# Patient Record
Sex: Female | Born: 1940 | Race: White | Hispanic: No
Health system: Southern US, Community
[De-identification: ages and names within clinical notes are randomized; demographics above are authoritative.]

---

## 1998-05-01 ENCOUNTER — Other Ambulatory Visit: Admission: RE | Admit: 1998-05-01 | Discharge: 1998-05-01 | Payer: Self-pay | Admitting: Obstetrics and Gynecology

## 1999-06-02 ENCOUNTER — Other Ambulatory Visit: Admission: RE | Admit: 1999-06-02 | Discharge: 1999-06-02 | Payer: Self-pay | Admitting: Obstetrics and Gynecology

## 1999-09-02 ENCOUNTER — Emergency Department (HOSPITAL_COMMUNITY): Admission: EM | Admit: 1999-09-02 | Discharge: 1999-09-02 | Payer: Self-pay | Admitting: Emergency Medicine

## 2000-08-03 ENCOUNTER — Other Ambulatory Visit: Admission: RE | Admit: 2000-08-03 | Discharge: 2000-08-03 | Payer: Self-pay | Admitting: Obstetrics and Gynecology

## 2002-02-09 ENCOUNTER — Other Ambulatory Visit: Admission: RE | Admit: 2002-02-09 | Discharge: 2002-02-09 | Payer: Self-pay | Admitting: Obstetrics and Gynecology

## 2003-02-28 ENCOUNTER — Other Ambulatory Visit: Admission: RE | Admit: 2003-02-28 | Discharge: 2003-02-28 | Payer: Self-pay | Admitting: Obstetrics and Gynecology

## 2004-06-25 ENCOUNTER — Other Ambulatory Visit: Admission: RE | Admit: 2004-06-25 | Discharge: 2004-06-25 | Payer: Self-pay | Admitting: Obstetrics and Gynecology

## 2005-02-11 ENCOUNTER — Encounter: Admission: RE | Admit: 2005-02-11 | Discharge: 2005-02-11 | Payer: Self-pay | Admitting: Internal Medicine

## 2005-07-30 ENCOUNTER — Other Ambulatory Visit: Admission: RE | Admit: 2005-07-30 | Discharge: 2005-07-30 | Payer: Self-pay | Admitting: Obstetrics and Gynecology

## 2005-09-01 ENCOUNTER — Encounter: Admission: RE | Admit: 2005-09-01 | Discharge: 2005-09-01 | Payer: Self-pay | Admitting: Internal Medicine

## 2005-09-01 ENCOUNTER — Other Ambulatory Visit: Admission: RE | Admit: 2005-09-01 | Discharge: 2005-09-01 | Payer: Self-pay | Admitting: Interventional Radiology

## 2005-09-01 ENCOUNTER — Encounter (INDEPENDENT_AMBULATORY_CARE_PROVIDER_SITE_OTHER): Payer: Self-pay | Admitting: Specialist

## 2006-08-05 ENCOUNTER — Other Ambulatory Visit: Admission: RE | Admit: 2006-08-05 | Discharge: 2006-08-05 | Payer: Self-pay | Admitting: Obstetrics and Gynecology

## 2007-08-10 ENCOUNTER — Other Ambulatory Visit: Admission: RE | Admit: 2007-08-10 | Discharge: 2007-08-10 | Payer: Self-pay | Admitting: Obstetrics and Gynecology

## 2008-08-16 ENCOUNTER — Ambulatory Visit: Payer: Self-pay | Admitting: Obstetrics and Gynecology

## 2008-08-16 ENCOUNTER — Encounter: Payer: Self-pay | Admitting: Obstetrics and Gynecology

## 2008-08-16 ENCOUNTER — Other Ambulatory Visit: Admission: RE | Admit: 2008-08-16 | Discharge: 2008-08-16 | Payer: Self-pay | Admitting: Obstetrics and Gynecology

## 2008-09-20 ENCOUNTER — Ambulatory Visit: Payer: Self-pay | Admitting: Obstetrics and Gynecology

## 2008-10-09 ENCOUNTER — Ambulatory Visit: Payer: Self-pay | Admitting: Gynecology

## 2008-10-17 ENCOUNTER — Ambulatory Visit: Payer: Self-pay | Admitting: Obstetrics and Gynecology

## 2008-10-22 ENCOUNTER — Ambulatory Visit: Payer: Self-pay | Admitting: Obstetrics and Gynecology

## 2009-02-11 ENCOUNTER — Ambulatory Visit: Payer: Self-pay | Admitting: Obstetrics and Gynecology

## 2009-02-28 ENCOUNTER — Other Ambulatory Visit: Admission: RE | Admit: 2009-02-28 | Discharge: 2009-02-28 | Payer: Self-pay | Admitting: Otolaryngology

## 2009-03-13 ENCOUNTER — Ambulatory Visit (HOSPITAL_COMMUNITY): Admission: RE | Admit: 2009-03-13 | Discharge: 2009-03-13 | Payer: Self-pay | Admitting: Otolaryngology

## 2009-03-13 ENCOUNTER — Encounter: Payer: Self-pay | Admitting: Otolaryngology

## 2009-03-21 ENCOUNTER — Ambulatory Visit: Payer: Self-pay | Admitting: Oncology

## 2009-03-27 ENCOUNTER — Ambulatory Visit: Admission: RE | Admit: 2009-03-27 | Discharge: 2009-04-18 | Payer: Self-pay | Admitting: Radiation Oncology

## 2009-06-02 DEATH — deceased

## 2010-04-23 LAB — COMPREHENSIVE METABOLIC PANEL
ALT: 16 U/L (ref 0–35)
AST: 21 U/L (ref 0–37)
CO2: 28 mEq/L (ref 19–32)
Chloride: 101 mEq/L (ref 96–112)
Creatinine, Ser: 0.77 mg/dL (ref 0.4–1.2)
GFR calc Af Amer: >60 mL/min (ref >60)
Glucose, Bld: 103 mg/dL — ABNORMAL HIGH (ref 70–99)

## 2010-04-23 LAB — CBC
HCT: 44.9 % (ref 36.0–46.0)
Hemoglobin: 15.6 g/dL — ABNORMAL HIGH (ref 12.0–15.0)
MCV: 92.5 fL (ref 78.0–100.0)
RBC: 4.85 MIL/uL (ref 3.87–5.11)
RDW: 12.1 % (ref 11.5–15.5)
WBC: 6.5 10*3/uL (ref 4.0–10.5)

## 2011-02-05 IMAGING — CT CT CHEST W/O CM
4 of 6 series · 15 of 30 positions shown, 17 images · non-contrast
Comparison: CT scan 02/27/2009 [HOSPITAL]

CT NECK

CLINICAL DATA: Left-sided thyroid cancer.  Rapid growth of the
left neck mass.

CT NECK AND CHEST WITHOUT CONTRAST
TECHNIQUE: Multidetector CT imaging of the neck and chest was
performed using the standard protocol without intravenous contrast.

[Series 2: chest 1st/ 70 sec delay neck · axial · delayed · 0.74mm/px · z∈[-420,-126]mm · 5 of 102 slices shown, 7 images (1 of 2)]
[im 21/102  mediastinal]
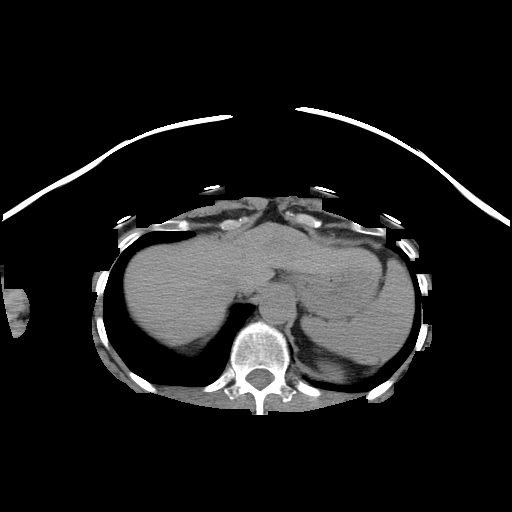
[im 21/102  lung]
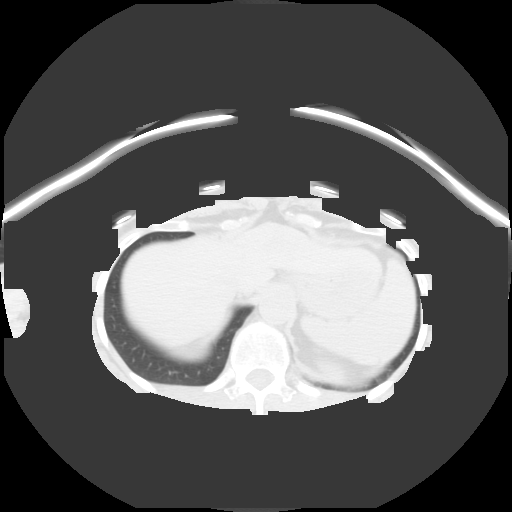
[im 41/102  lung]
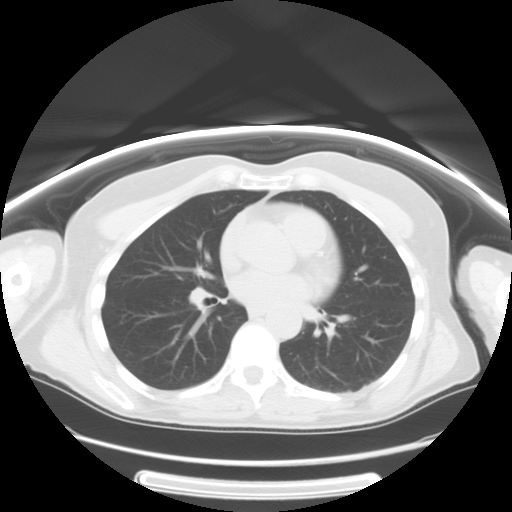
[im 50/102  lung]
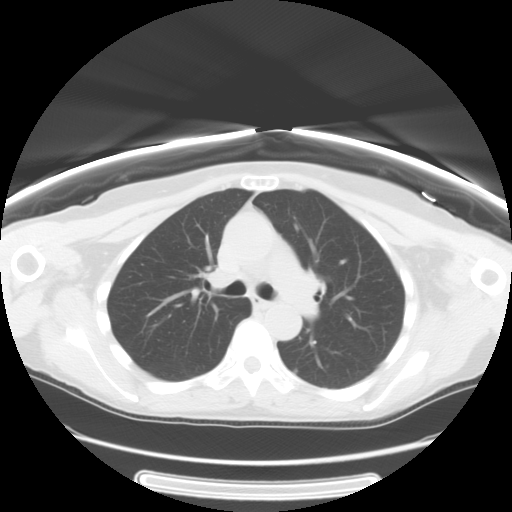
[im 61/102  lung]
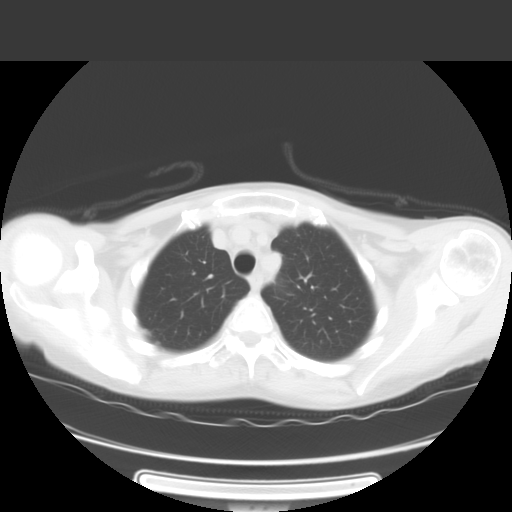
[im 81/102  mediastinal]
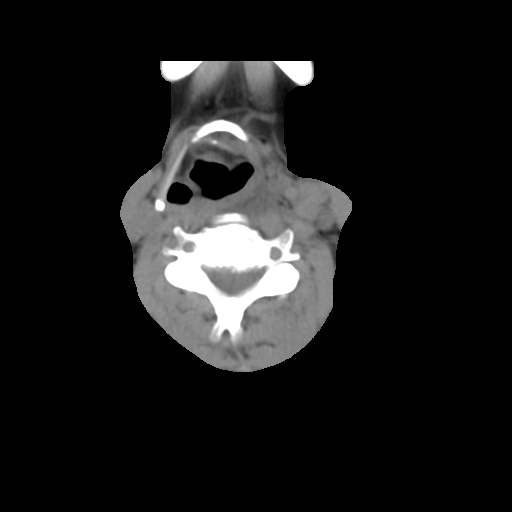
[im 81/102  lung]
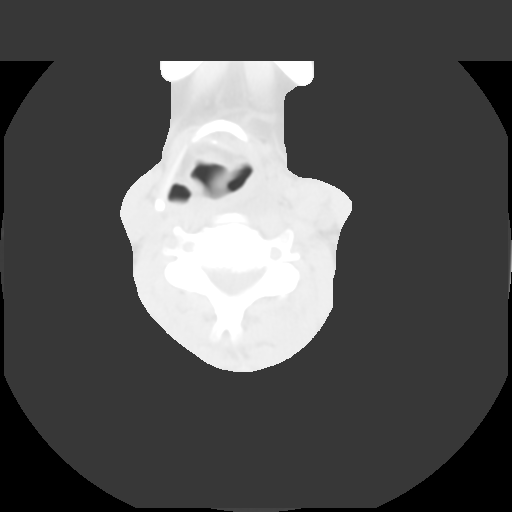

[Series 102: chest 1st/ 70 sec delay neck · axial · delayed · 0.74mm/px · z∈[-446,-277]mm · 4 of 79 slices shown (2 of 2)]
[im 20/79  lung]
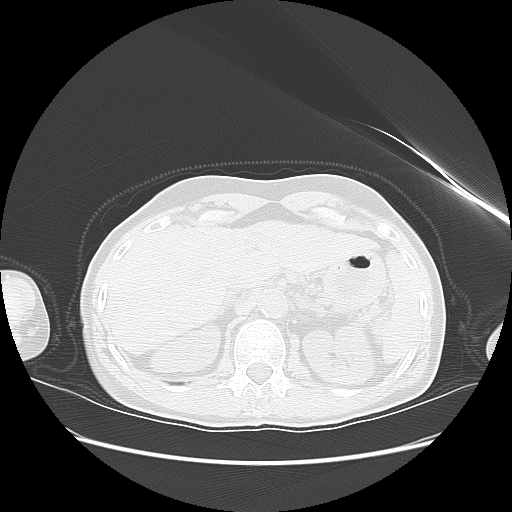
[im 40/79  lung]
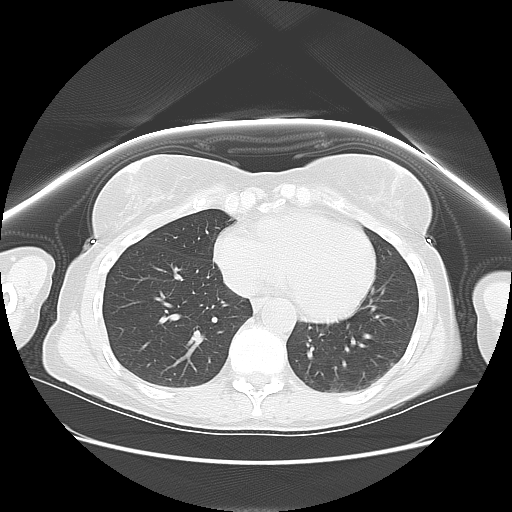
[im 59/79  lung]
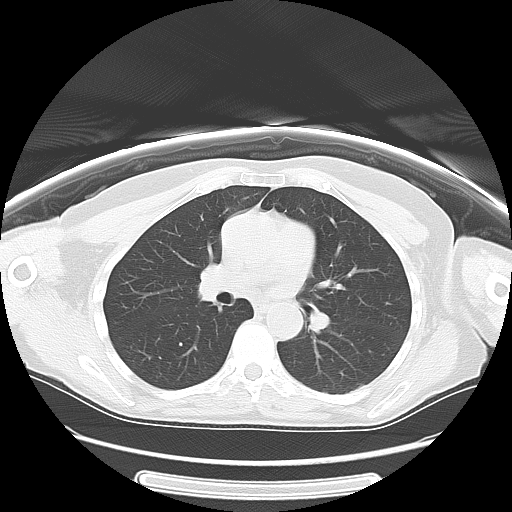
[im 65/79  lung]
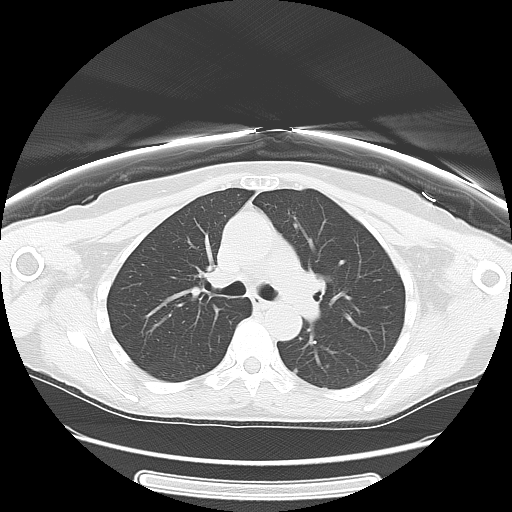

[Series 103: sagittals · sagittal · 0.74mm/px · 4 of 93 slices shown]
[im 19/93  lung]
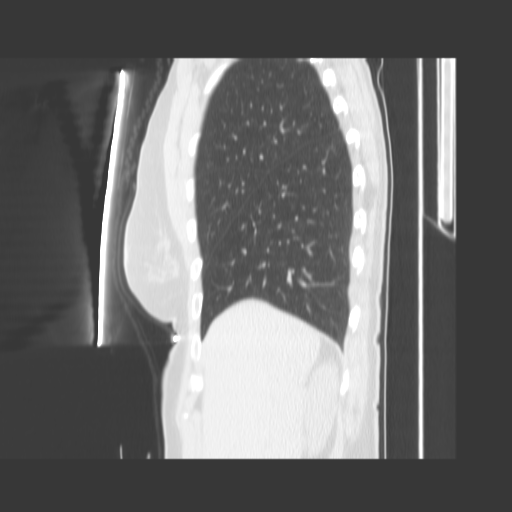
[im 37/93  lung]
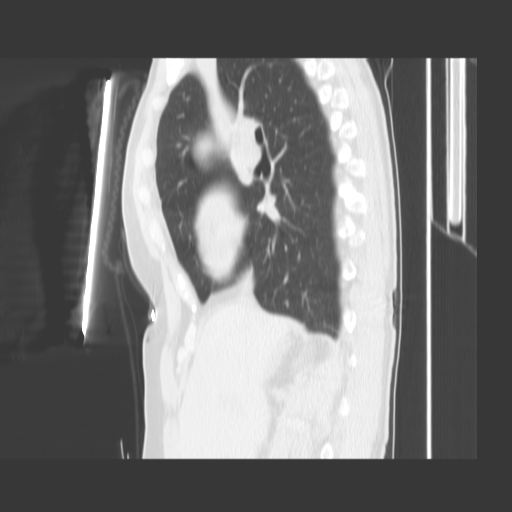
[im 56/93  lung]
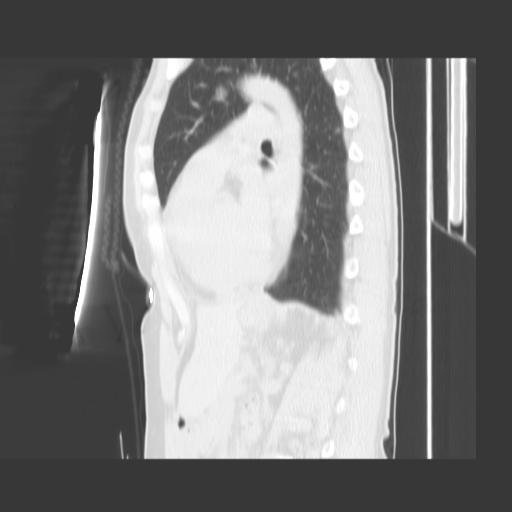
[im 74/93  lung]
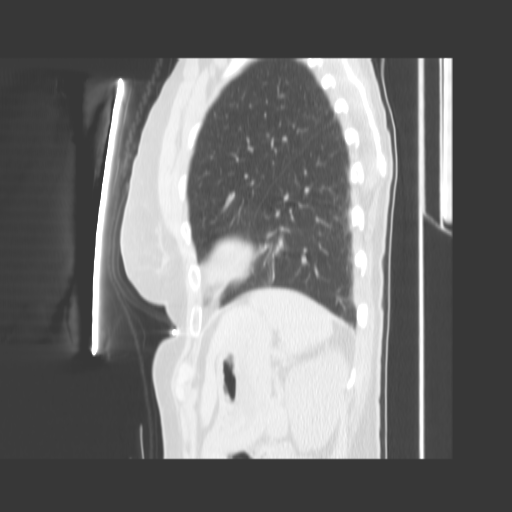

[Series 104: coronals · coronal · 0.74mm/px · 2 of 81 slices shown]
[im 21/81  lung]
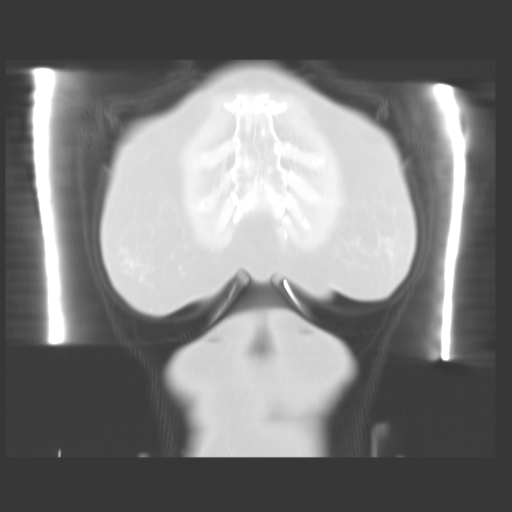
[im 41/81  lung]
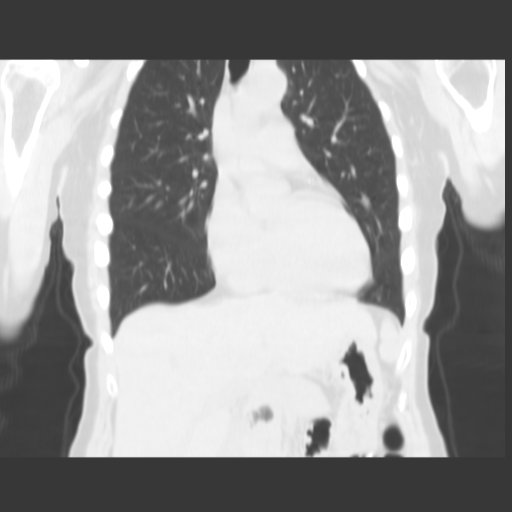

[15 of 30 positions shown; findings below may reference images not displayed]

FINDINGS: There has been considerable and rapid growth of the left
neck mass since late [REDACTED].  Previously, maximal transverse
diameter was approximately 5 x 3 cm.  Today, the mass measures
cm right to left and 5.3 cm front to back.

Within the left lobe of the thyroid, there is a densely calcified
focus measuring 9 mm in diameter.  Adjacent to that, there is a
peripherally calcified mass measuring 2.5 cm in diameter.
Surrounding and extending outward from that, there is abnormal low
density material consistent with tumor spread.  This displaces the
larynx, the subglottic trachea and the esophagus towards the right.
This invades the soft tissue planes of the neck centrally and
towards the left and completely encases the common carotid artery.
Upper extent of tumor is as high as the hyoid bone and extends as
far down as the thoracic inlet.  The only a definable separate
lymph node is on image 22, a level II or level III node measuring 8
mm in diameter.
IMPRESSION: Since late [REDACTED], there has been considerable growth of tumor in
the left side of the neck displacing the midline structures towards
the right.  Transverse diameter now is approximately 7.4 x 5.3 cm.
Tumor encases the left common carotid artery.  I cannot see
evidence of of the jugular vein and presumably this is obliterated.

CT CHEST
FINDINGS: Lung apices show pleural and parenchymal scarring.
Posteriorly in the right lobe on image number 31, there is a 7 mm
pulmonary nodule worrisome for a metastatic deposit.  No second
nodule is seen on the right.  On the left, there is a nodule the
left upper lobe adjacent to the aortic arch on image #7 measuring 8
mm in diameter.  There are there is a peripheral nodule in the left
lower lobe on image number 15 measuring 4 mm in diameter.  There is
a subpleural nodule in the left lower lobe on image 16 measuring 4
mm in diameter.  There is a subpleural nodule on image 19 measuring
2 mm in diameter.  There are numerous subpleural nodules in the
left lower lobe on images 23, 24 and 25.  There is a left lower
lobe nodule on image 38 measuring 5 mm in diameter.  A few other
small subpleural nodules are present in the left lower lobe.  These
could be benign or represent metastatic disease.  No mediastinal
adenopathy is discernible.  Scans in the upper abdomen shows some
prominence of the left adrenal gland.  This could represent mild
hyperplasia, but early metastatic involvement is not excluded.

The left neck tumor does extend through the thoracic inlet into the
upper mediastinum.
IMPRESSION: Large left neck tumor extends through the thoracic inlet on the
left into the superior mediastinum.

Numerous pulmonary nodules.  These could be benign or malignant are
worrisome for metastatic disease.

Prominence of the left adrenal gland.  This could be benign
hyperplasia or could represent early manifestation of metastatic
disease.
